# Patient Record
Sex: Male | Born: 1995 | Race: Asian | Hispanic: No | Marital: Single | Smoking: Current every day smoker
Health system: Southern US, Community
[De-identification: ages and names within clinical notes are randomized; demographics above are authoritative.]

## PROBLEM LIST (undated history)

## (undated) DIAGNOSIS — F32A Depression, unspecified: Secondary | ICD-10-CM

## (undated) DIAGNOSIS — F329 Major depressive disorder, single episode, unspecified: Secondary | ICD-10-CM

---
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
## 2014-06-29 ENCOUNTER — Other Ambulatory Visit (HOSPITAL_COMMUNITY)
Admission: RE | Admit: 2014-06-29 | Discharge: 2014-06-29 | Disposition: A | Discharge status: Home / Self Care | Payer: Self-pay | Source: Ambulatory Visit | Attending: Family Medicine | Admitting: Family Medicine

## 2014-06-29 ENCOUNTER — Encounter (HOSPITAL_COMMUNITY): Payer: Self-pay | Admitting: *Deleted

## 2014-06-29 ENCOUNTER — Emergency Department (INDEPENDENT_AMBULATORY_CARE_PROVIDER_SITE_OTHER)
Admission: EM | Admit: 2014-06-29 | Discharge: 2014-06-29 | Disposition: A | Discharge status: Home / Self Care | Payer: Self-pay | Source: Home / Self Care | Attending: Family Medicine | Admitting: Family Medicine

## 2014-06-29 DIAGNOSIS — Z113 Encounter for screening for infections with a predominantly sexual mode of transmission: Secondary | ICD-10-CM | POA: Insufficient documentation

## 2014-06-29 DIAGNOSIS — Z711 Person with feared health complaint in whom no diagnosis is made: Secondary | ICD-10-CM

## 2014-06-29 NOTE — Discharge Instructions (Signed)
We will call with positive test results and treat as indicated  °

## 2014-06-29 NOTE — ED Notes (Signed)
PT  WANTS  TO  BE  CHECKED  FOR   STD      HE        REPORTS      A   FOUL  ODOR  AT  TIME   HE  DENYS  ANY  DISCHARGE   OR  ANY  LESIONS

## 2014-06-29 NOTE — ED Provider Notes (Signed)
CSN: [id number]     Arrival date & time 06/29/14  1437 History   First MD Initiated Contact with Patient 06/29/14 1602     Chief Complaint  Patient presents with  . Exposure to STD   (Consider location/radiation/quality/duration/timing/severity/associated sxs/prior Treatment) Patient is a 19 y.o. male presenting with STD exposure. The history is provided by the patient.  Exposure to STD This is a new problem. The current episode started more than 1 week ago (for sev mos has noticed with girlfriend an odor to sexual fluids. no sx however.). The problem has not changed since onset.Pertinent negatives include no chest pain and no abdominal pain.    Past Medical History  Diagnosis Date  . Depression    No past surgical history on file. No family history on file. History  Substance Use Topics  . Smoking status: Current Every Day Smoker -- 2.00 packs/day    Types: Cigarettes  . Smokeless tobacco: Not on file  . Alcohol Use: Yes    Review of Systems  Constitutional: Negative.   Cardiovascular: Negative for chest pain.  Gastrointestinal: Negative for abdominal pain.  Genitourinary: Negative for dysuria, urgency, discharge, penile swelling, scrotal swelling, penile pain and testicular pain.    Allergies  Review of patient's allergies indicates no known allergies.  Home Medications   Prior to Admission medications   Medication Sig Start Date End Date Taking? Authorizing Provider  benztropine (COGENTIN) 0.5 MG tablet Take 1 tablet (0.5 mg total) by mouth daily. 04/12/12   Wonda Cerise, MD  citalopram (CELEXA) 20 MG tablet Take 1 tablet (20 mg total) by mouth daily. 04/12/12   Wonda Cerise, MD  haloperidol (HALDOL) 5 MG tablet Take 1 tablet (5 mg total) by mouth at bedtime. 04/12/12   Wonda Cerise, MD  traZODone (DESYREL) 50 MG tablet Take 1 tablet (50 mg total) by mouth at bedtime as needed for sleep. 04/12/12   Wonda Cerise, MD   There were no vitals taken for this visit. Physical Exam   Constitutional: He is oriented to person, place, and time. He appears well-developed and well-nourished.  Abdominal: Soft. Bowel sounds are normal.  Genitourinary: Penis normal.  Neurological: He is alert and oriented to person, place, and time.  Skin: Skin is warm and dry.  Nursing note and vitals reviewed.   ED Course  Procedures (including critical care time) Labs Review Labs Reviewed  HIV ANTIBODY (ROUTINE TESTING)  RPR  CYTOLOGY, (ORAL, ANAL, URETHRAL) ANCILLARY ONLY    Imaging Review No results found.   MDM   1. Concern about STD in male without diagnosis        Linna Hoff, MD 06/29/14 [phone]

## 2014-06-30 LAB — RPR: RPR Ser Ql: NONREACTIVE

## 2014-06-30 LAB — CYTOLOGY, (ORAL, ANAL, URETHRAL) ANCILLARY ONLY
CHLAMYDIA, DNA PROBE: NEGATIVE
Neisseria Gonorrhea: NEGATIVE

## 2014-06-30 LAB — HIV ANTIBODY (ROUTINE TESTING W REFLEX): HIV SCREEN 4TH GENERATION: NONREACTIVE

## 2014-07-01 NOTE — ED Notes (Addendum)
HIV report non-reactive, RPR non-reactive

## 2014-07-01 NOTE — ED Notes (Signed)
Final report of GC, chlamydia negative 

## 2015-04-16 ENCOUNTER — Encounter (HOSPITAL_COMMUNITY): Payer: Self-pay | Admitting: Emergency Medicine

## 2015-04-16 ENCOUNTER — Emergency Department (HOSPITAL_COMMUNITY)
Admission: EM | Admit: 2015-04-16 | Discharge: 2015-04-16 | Disposition: A | Discharge status: Home / Self Care | Payer: Self-pay | Attending: Emergency Medicine | Admitting: Emergency Medicine

## 2015-04-16 DIAGNOSIS — F17210 Nicotine dependence, cigarettes, uncomplicated: Secondary | ICD-10-CM | POA: Insufficient documentation

## 2015-04-16 DIAGNOSIS — F329 Major depressive disorder, single episode, unspecified: Secondary | ICD-10-CM | POA: Insufficient documentation

## 2015-04-16 DIAGNOSIS — Z79899 Other long term (current) drug therapy: Secondary | ICD-10-CM | POA: Insufficient documentation

## 2015-04-16 DIAGNOSIS — F410 Panic disorder [episodic paroxysmal anxiety] without agoraphobia: Secondary | ICD-10-CM | POA: Insufficient documentation

## 2015-04-16 NOTE — ED Notes (Signed)
Pt states he had an episode of SOB with a "fast heart rate and chest pain." States he feels like his heart rate has slowed since, but that he still feels SOB. States he was on the phone with his girlfriend in the middle of an argument when it started. Hx of smoking. No other pertinent cardiac history.

## 2015-04-16 NOTE — ED Notes (Signed)
Awake. Verbally responsive. A/O x4. Resp even and unlabored. No audible adventitious breath sounds noted. ABC's intact.  

## 2015-04-16 NOTE — Discharge Instructions (Signed)

## 2015-04-16 NOTE — ED Provider Notes (Signed)
CSN: [id number]     Arrival date & time 04/16/15  1040 History   First MD Initiated Contact with Patient 04/16/15 1048     Chief Complaint  Patient presents with  . Shortness of Breath  . Chest Pain     HPI  She presents for evaluation after an episode of chest pain, palpitations, shortness of breath. He states he was in a rather heated verbal argument with his fiance over the phone. Felt like his heart rate is back to normal. His breathing is normal eyes. He does not feel anxious. He describes numbness in his arms that is resolved. No perioral numbness  Past Medical History  Diagnosis Date  . Depression    History reviewed. No pertinent past surgical history. History reviewed. No pertinent family history. Social History  Substance Use Topics  . Smoking status: Current Every Day Smoker -- 2.00 packs/day    Types: Cigarettes  . Smokeless tobacco: None  . Alcohol Use: Yes    Review of Systems  Constitutional: Negative for fever, chills, diaphoresis, appetite change and fatigue.  HENT: Negative for mouth sores, sore throat and trouble swallowing.   Eyes: Negative for visual disturbance.  Respiratory: Positive for shortness of breath. Negative for cough, chest tightness and wheezing.   Cardiovascular: Positive for palpitations. Negative for chest pain.  Gastrointestinal: Negative for nausea, vomiting, abdominal pain, diarrhea and abdominal distention.  Endocrine: Negative for polydipsia, polyphagia and polyuria.  Genitourinary: Negative for dysuria, frequency and hematuria.  Musculoskeletal: Negative for gait problem.  Skin: Negative for color change, pallor and rash.  Neurological: Negative for dizziness, syncope, light-headedness and headaches.  Hematological: Does not bruise/bleed easily.  Psychiatric/Behavioral: Negative for behavioral problems and confusion.      Allergies  Review of patient's allergies indicates no known allergies.  Home Medications   Prior to  Admission medications   Medication Sig Start Date End Date Taking? Authorizing Provider  benztropine (COGENTIN) 0.5 MG tablet Take 1 tablet (0.5 mg total) by mouth daily. 04/12/12   Wonda Cerise, MD  citalopram (CELEXA) 20 MG tablet Take 1 tablet (20 mg total) by mouth daily. 04/12/12   Wonda Cerise, MD  haloperidol (HALDOL) 5 MG tablet Take 1 tablet (5 mg total) by mouth at bedtime. 04/12/12   Wonda Cerise, MD  traZODone (DESYREL) 50 MG tablet Take 1 tablet (50 mg total) by mouth at bedtime as needed for sleep. 04/12/12   Wonda Cerise, MD   BP 114/86 mmHg  Pulse 63  Temp(Src) 97.9 F (36.6 C) (Oral)  Resp 18  SpO2 100% Physical Exam  Constitutional: He is oriented to person, place, and time. He appears well-developed and well-nourished. No distress.  HENT:  Head: Normocephalic.  Eyes: Conjunctivae are normal. Pupils are equal, round, and reactive to light. No scleral icterus.  Neck: Normal range of motion. Neck supple. No thyromegaly present.  Cardiovascular: Normal rate and regular rhythm.  Exam reveals no gallop and no friction rub.   No murmur heard. Pulmonary/Chest: Effort normal and breath sounds normal. No respiratory distress. He has no wheezes. He has no rales.  Abdominal: Soft. Bowel sounds are normal. He exhibits no distension. There is no tenderness. There is no rebound.  Musculoskeletal: Normal range of motion.  Neurological: He is alert and oriented to person, place, and time.  Skin: Skin is warm and dry. No rash noted.  Psychiatric: He has a normal mood and affect. His behavior is normal.    ED Course  Procedures (including critical care  time) Labs Review Labs Reviewed - No data to display  Imaging Review No results found. I have personally reviewed and evaluated these images and lab results as part of my medical decision-making.   EKG Interpretation None      MDM   Final diagnoses:  Panic attack    Normal exam. Normal EKG. Classic panic attack. I discussed this  with him.    Rolland PorterMark Ortencia Askari, MD 04/16/15 [phone]

## 2015-04-16 NOTE — ED Notes (Signed)
Pt reported having episode of having palpitations and SHOB and when his heart slowed down [facility] to arguing on phone with girlfriend. Pt denies having panic attacks/anxiety attacks prior.
